# Patient Record
Sex: Male | Born: 1995 | Race: Black or African American | Hispanic: No | Marital: Single | Smoking: Current every day smoker
Health system: Southern US, Community
[De-identification: ages and names within clinical notes are randomized; demographics above are authoritative.]

## PROBLEM LIST (undated history)

## (undated) HISTORY — PX: TONSILLECTOMY: SUR1361

## (undated) HISTORY — PX: OTHER SURGICAL HISTORY: SHX169

---
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
## 2015-07-30 ENCOUNTER — Encounter: Payer: Self-pay | Admitting: *Deleted

## 2015-07-30 ENCOUNTER — Emergency Department
Admission: EM | Admit: 2015-07-30 | Discharge: 2015-07-30 | Disposition: A | Discharge status: Home / Self Care | Payer: Medicaid Other | Attending: Emergency Medicine | Admitting: Emergency Medicine

## 2015-07-30 ENCOUNTER — Emergency Department: Payer: Medicaid Other

## 2015-07-30 DIAGNOSIS — Y999 Unspecified external cause status: Secondary | ICD-10-CM | POA: Diagnosis not present

## 2015-07-30 DIAGNOSIS — S50812A Abrasion of left forearm, initial encounter: Secondary | ICD-10-CM | POA: Insufficient documentation

## 2015-07-30 DIAGNOSIS — S025XXA Fracture of tooth (traumatic), initial encounter for closed fracture: Secondary | ICD-10-CM

## 2015-07-30 DIAGNOSIS — S8002XA Contusion of left knee, initial encounter: Secondary | ICD-10-CM | POA: Diagnosis not present

## 2015-07-30 DIAGNOSIS — F1290 Cannabis use, unspecified, uncomplicated: Secondary | ICD-10-CM | POA: Insufficient documentation

## 2015-07-30 DIAGNOSIS — T07XXXA Unspecified multiple injuries, initial encounter: Secondary | ICD-10-CM

## 2015-07-30 DIAGNOSIS — F17210 Nicotine dependence, cigarettes, uncomplicated: Secondary | ICD-10-CM | POA: Insufficient documentation

## 2015-07-30 DIAGNOSIS — Z791 Long term (current) use of non-steroidal anti-inflammatories (NSAID): Secondary | ICD-10-CM | POA: Diagnosis not present

## 2015-07-30 DIAGNOSIS — Z792 Long term (current) use of antibiotics: Secondary | ICD-10-CM | POA: Diagnosis not present

## 2015-07-30 DIAGNOSIS — L089 Local infection of the skin and subcutaneous tissue, unspecified: Secondary | ICD-10-CM

## 2015-07-30 DIAGNOSIS — Y9389 Activity, other specified: Secondary | ICD-10-CM | POA: Insufficient documentation

## 2015-07-30 DIAGNOSIS — S8992XA Unspecified injury of left lower leg, initial encounter: Secondary | ICD-10-CM | POA: Diagnosis present

## 2015-07-30 DIAGNOSIS — Y92410 Unspecified street and highway as the place of occurrence of the external cause: Secondary | ICD-10-CM | POA: Diagnosis not present

## 2015-07-30 DIAGNOSIS — S8012XA Contusion of left lower leg, initial encounter: Secondary | ICD-10-CM

## 2015-07-30 MED ORDER — LIDOCAINE VISCOUS 2 % MT SOLN
15.0000 mL | Freq: Once | OROMUCOSAL | Status: AC
  Administered 2015-07-30: 15 mL via OROMUCOSAL
  Filled 2015-07-30: qty 15

## 2015-07-30 MED ORDER — OXYCODONE-ACETAMINOPHEN 5-325 MG PO TABS: 1.0000 | ORAL_TABLET | ORAL | Status: DC | PRN

## 2015-07-30 MED ORDER — OXYCODONE-ACETAMINOPHEN 5-325 MG PO TABS
1.0000 | ORAL_TABLET | ORAL | Status: DC | PRN
  Administered 2015-07-30: 1 via ORAL
  Filled 2015-07-30: qty 1

## 2015-07-30 MED ORDER — LIDOCAINE VISCOUS 2 % MT SOLN: 5.0000 mL | Freq: Four times a day (QID) | OROMUCOSAL | Status: DC | PRN
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
## 2015-07-30 NOTE — Discharge Instructions (Signed)
Take medication as directed and follow-up with dentist for fractured tooth.
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
## 2015-07-30 NOTE — ED Notes (Signed)

## 2015-10-09 ENCOUNTER — Emergency Department
Admission: EM | Admit: 2015-10-09 | Discharge: 2015-10-09 | Disposition: A | Discharge status: Home / Self Care | Payer: Medicaid Other | Attending: Emergency Medicine | Admitting: Emergency Medicine

## 2015-10-09 ENCOUNTER — Emergency Department: Payer: Medicaid Other

## 2015-10-09 ENCOUNTER — Encounter: Payer: Self-pay | Admitting: Emergency Medicine

## 2015-10-09 DIAGNOSIS — X58XXXA Exposure to other specified factors, initial encounter: Secondary | ICD-10-CM | POA: Diagnosis not present

## 2015-10-09 DIAGNOSIS — Y999 Unspecified external cause status: Secondary | ICD-10-CM | POA: Insufficient documentation

## 2015-10-09 DIAGNOSIS — S99922A Unspecified injury of left foot, initial encounter: Secondary | ICD-10-CM | POA: Diagnosis present

## 2015-10-09 DIAGNOSIS — Y929 Unspecified place or not applicable: Secondary | ICD-10-CM | POA: Insufficient documentation

## 2015-10-09 DIAGNOSIS — Z792 Long term (current) use of antibiotics: Secondary | ICD-10-CM | POA: Insufficient documentation

## 2015-10-09 DIAGNOSIS — Y9301 Activity, walking, marching and hiking: Secondary | ICD-10-CM | POA: Insufficient documentation

## 2015-10-09 DIAGNOSIS — S90412A Abrasion, left great toe, initial encounter: Secondary | ICD-10-CM

## 2015-10-09 DIAGNOSIS — F17210 Nicotine dependence, cigarettes, uncomplicated: Secondary | ICD-10-CM | POA: Diagnosis not present

## 2015-10-09 DIAGNOSIS — Z791 Long term (current) use of non-steroidal anti-inflammatories (NSAID): Secondary | ICD-10-CM | POA: Insufficient documentation

## 2015-10-09 DIAGNOSIS — S90212A Contusion of left great toe with damage to nail, initial encounter: Secondary | ICD-10-CM | POA: Diagnosis not present

## 2015-10-09 MED ORDER — IBUPROFEN 600 MG PO TABS: 600.0000 mg | ORAL_TABLET | Freq: Four times a day (QID) | ORAL | 0 refills | Status: DC | PRN

## 2015-10-09 MED ORDER — CEPHALEXIN 500 MG PO CAPS: 500.0000 mg | ORAL_CAPSULE | Freq: Three times a day (TID) | ORAL | 0 refills | Status: DC
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
## 2015-10-09 NOTE — ED Provider Notes (Signed)
[facility] Emergency Department Provider Note  ____________________________________________  Time seen: Approximately 2:42 PM  I have reviewed the triage vital signs and the nursing notes.   HISTORY  Chief Complaint Toe Pain    HPI Gavin Ford is a 20 y.o. male , NAD, presents to the emergency department with several hour history of left great toe pain. States he was walking backwards and caused his left toe to go beneath the left foot. Had pain about the toenail and noted abrasions to the toe. Has had some mild oozing and weeping as well as bleeding to the areas. Has been able to move the left ankle, foot, toe but some pain with full motion of the left great toe. Denies previous injuries to this area. Has not completed any supportive care at home. Denies any numbness, weakness, tingling.   History reviewed. No pertinent past medical history.  There are no active problems to display for this patient.   Past Surgical History:  Procedure Laterality Date  . TONSILLECTOMY    . tubes in ears age 13     when he was about 4 or 5 yrs of age    Prior to Admission medications   Medication Sig Start Date End Date Taking? Authorizing Provider  cephALEXin (KEFLEX) 500 MG capsule Take 1 capsule (500 mg total) by mouth 3 (three) times daily. 10/09/15   Jami L Hagler, PA-C  doxycycline (VIBRAMYCIN) 100 MG capsule Take 1 capsule (100 mg total) by mouth 2 (two) times daily. 10/12/13   Hope Orlene Och, NP  HYDROcodone-acetaminophen (NORCO/VICODIN) 5-325 MG per tablet Take 1 tablet by mouth every 4 (four) hours as needed. 10/12/13   Hope Orlene Och, NP  ibuprofen (ADVIL,MOTRIN) 600 MG tablet Take 1 tablet (600 mg total) by mouth every 6 (six) hours as needed. 10/09/15   Jami L Hagler, PA-C  lidocaine (XYLOCAINE) 2 % solution Use as directed 5 mLs in the mouth or throat every 6 (six) hours as needed for mouth pain. 07/30/15   Joni Reining, PA-C  naproxen (NAPROSYN) 375 MG tablet  Take 1 tablet (375 mg total) by mouth 2 (two) times daily. 10/12/13   Hope Orlene Och, NP  oxyCODONE-acetaminophen (ROXICET) 5-325 MG tablet Take 1 tablet by mouth every 4 (four) hours as needed for severe pain. 07/30/15   Joni Reining, PA-C    Allergies Review of patient's allergies indicates no known allergies.  Family History  Problem Relation Age of Onset  . Hypertension Father     Social History Social History  Substance Use Topics  . Smoking status: Current Every Day Smoker    Types: Cigarettes  . Smokeless tobacco: Never Used  . Alcohol use Yes     Comment: occasionally     Review of Systems Constitutional: No fever/chills, fatigue Musculoskeletal:  Positive for left great toe pain and loose nail. Negative for back, hip, knee, ankle, foot pain.  Skin: Positive abrasions left great toe. Negative for rash, swelling, bruising. Neurological: Negative for headaches, focal weakness or numbness. 10-point ROS otherwise negative.  ____________________________________________   PHYSICAL EXAM:  VITAL SIGNS: ED Triage Vitals  Enc Vitals Group     BP 10/09/15 1301 112/66     Pulse Rate 10/09/15 1301 91     Resp 10/09/15 1301 16     Temp 10/09/15 1301 98.4 F (36.9 C)     Temp Source 10/09/15 1301 Oral     SpO2 10/09/15 1301 97 %  Weight 10/09/15 1300 176 lb (79.8 kg)     Height 10/09/15 1300 5\' 11"  (1.803 m)     Head Circumference --      Peak Flow --      Pain Score 10/09/15 1301 5     Pain Loc --      Pain Edu? --      Excl. in GC? --      Constitutional: Alert and oriented. Well appearing and in no acute distress. Eyes: Conjunctivae are normal without injection.  Head: Atraumatic. Cardiovascular: Good peripheral circulation with 2+ pulses about the left lower extremity. Respiratory: Normal respiratory effort without tachypnea or retractions.  Musculoskeletal: FROM of the left ankle, foot toes but pain with movement of the left great toe. No lower extremity  tenderness nor edema.  No joint effusions. Neurologic:  Normal speech and language. No gross focal neurologic deficits are appreciated.  Skin:  Abrasions noted about the left great toe without active bleeding. Yellow oozing noted from the left great toenail and it is loose about the distal nail bed, but anchored well. Skin is warm, dry. No rash noted. Psychiatric: Mood and affect are normal. Speech and behavior are normal. Patient exhibits appropriate insight and judgement.   ____________________________________________   LABS  None ____________________________________________  EKG  None ____________________________________________  RADIOLOGY I have personally viewed and evaluated these images (plain radiographs) as part of my medical decision making, as well as reviewing the written report by the radiologist.  Dg Toe Great Left  Result Date: 10/09/2015 CLINICAL DATA:  First toe pain status post trauma. EXAM: LEFT GREAT TOE COMPARISON:  None. FINDINGS: There is no evidence of fracture or dislocation. There is no evidence of arthropathy or other focal bone abnormality. Soft tissues swelling about the first phalanx. IMPRESSION: No acute fracture or dislocation identified about the first phalanx. Soft tissue swelling. Electronically Signed   By: Ted Mcalpineobrinka  Dimitrova M.D.   On: 10/09/2015 14:23    ____________________________________________    PROCEDURES  Procedure(s) performed: None   Procedures   Medications - No data to display   ____________________________________________   INITIAL IMPRESSION / ASSESSMENT AND PLAN / ED COURSE  Pertinent labs & imaging results that were available during my care of the patient were reviewed by me and considered in my medical decision making (see chart for details).  Clinical Course    Patient's diagnosis is consistent with Abrasion and contusion of left great toe. Patient's toe was wrapped with nonstick gauze and he was placed in a  flat bottom cast shoe for comfort care.  Patient will be discharged home with prescriptions for Keflex and ibuprofen to take as directed. Patient is to [facility] if symptoms persist past this treatment course. Patient is given ED precautions to return to the ED for any worsening or new symptoms.    ____________________________________________  FINAL CLINICAL IMPRESSION(S) / ED DIAGNOSES  Final diagnoses:  Abrasion of left great toe, initial encounter  Contusion of left great toe with damage to nail, initial encounter      NEW MEDICATIONS STARTED DURING THIS VISIT:  Discharge Medication List as of 10/09/2015  3:12 PM    START taking these medications   Details  cephALEXin (KEFLEX) 500 MG capsule Take 1 capsule (500 mg total) by mouth 3 (three) times daily., Starting Sun 10/09/2015, Print    ibuprofen (ADVIL,MOTRIN) 600 MG tablet Take 1 tablet (600 mg total) by mouth every 6 (six) hours as needed., Starting Sun 10/09/2015,  Print             Hope Pigeon, PA-C 10/09/15 1533    Arnaldo Natal, MD 10/11/15 [phone]

## 2015-10-09 NOTE — Discharge Instructions (Signed)
Keep foot elevated when not walking for the next 2 days

## 2016-03-01 ENCOUNTER — Emergency Department (HOSPITAL_COMMUNITY)
Admission: EM | Admit: 2016-03-01 | Discharge: 2016-03-01 | Disposition: A | Discharge status: Home / Self Care | Payer: Medicaid Other | Attending: Emergency Medicine | Admitting: Emergency Medicine

## 2016-03-01 ENCOUNTER — Encounter (HOSPITAL_COMMUNITY): Payer: Self-pay | Admitting: *Deleted

## 2016-03-01 DIAGNOSIS — L0231 Cutaneous abscess of buttock: Secondary | ICD-10-CM | POA: Insufficient documentation

## 2016-03-01 DIAGNOSIS — F17210 Nicotine dependence, cigarettes, uncomplicated: Secondary | ICD-10-CM | POA: Insufficient documentation

## 2016-03-01 MED ORDER — LIDOCAINE HCL (PF) 1 % IJ SOLN
5.0000 mL | Freq: Once | INTRAMUSCULAR | Status: DC
  Filled 2016-03-01: qty 5

## 2016-03-01 MED ORDER — OXYCODONE-ACETAMINOPHEN 5-325 MG PO TABS
1.0000 | ORAL_TABLET | Freq: Once | ORAL | Status: AC
  Administered 2016-03-01: 1 via ORAL
  Filled 2016-03-01: qty 1

## 2016-03-01 MED ORDER — CEPHALEXIN 500 MG PO CAPS
500.0000 mg | ORAL_CAPSULE | Freq: Once | ORAL | Status: AC
  Administered 2016-03-01: 500 mg via ORAL
  Filled 2016-03-01: qty 1

## 2016-03-01 MED ORDER — NAPROXEN 500 MG PO TABS: 500.0000 mg | ORAL_TABLET | Freq: Two times a day (BID) | ORAL | 0 refills | Status: DC

## 2016-03-01 MED ORDER — SULFAMETHOXAZOLE-TRIMETHOPRIM 800-160 MG PO TABS: 1.0000 | ORAL_TABLET | Freq: Two times a day (BID) | ORAL | 0 refills | Status: AC

## 2016-03-01 MED ORDER — CEPHALEXIN 500 MG PO CAPS: 500.0000 mg | ORAL_CAPSULE | Freq: Two times a day (BID) | ORAL | 0 refills | Status: AC

## 2016-03-01 MED ORDER — SULFAMETHOXAZOLE-TRIMETHOPRIM 800-160 MG PO TABS
1.0000 | ORAL_TABLET | Freq: Once | ORAL | Status: AC
  Administered 2016-03-01: 1 via ORAL
  Filled 2016-03-01: qty 1

## 2016-03-01 NOTE — ED Provider Notes (Signed)
[facility]-EMERGENCY DEPT Provider Note   CSN: [id number] Arrival date & time: 03/01/16  1253  History   Chief Complaint Chief Complaint  Patient presents with  . Abscess    HPI Gavin Ford is a 21 y.o. male.  HPI  21 y.o. male presents to the Emergency Department today complaining of abscess to left inner buttocks x 2 days. Notes hx same. States that it burst yesterday with some purulence and bloody discharge. No fevers at home. BMs unchanged. Rates pain 5/10. Worse with palpation. Minimal at rest. No other symptoms noted.   History reviewed. No pertinent past medical history.  There are no active problems to display for this patient.   Past Surgical History:  Procedure Laterality Date  . TONSILLECTOMY    . tubes in ears age 89     when he was about 4 or 5 yrs of age     Home Medications    Prior to Admission medications   Not on File    Family History Family History  Problem Relation Age of Onset  . Hypertension Father     Social History Social History  Substance Use Topics  . Smoking status: Current Every Day Smoker    Types: Cigarettes  . Smokeless tobacco: Never Used     Comment: 1 pack every 3 days  . Alcohol use Yes     Comment: occasionally     Allergies   Patient has no known allergies.   Review of Systems Review of Systems  Constitutional: Negative for fever.  Gastrointestinal: Negative for abdominal pain, diarrhea, nausea and vomiting.  Skin: Positive for wound.  Allergic/Immunologic: Negative for immunocompromised state.   Physical Exam Updated Vital Signs BP 139/87 (BP Location: Left Arm)   Pulse 85   Temp 98.6 F (37 C) (Oral)   Resp 18   Ht 6' (1.829 m)   Wt 81.6 kg   SpO2 98%   BMI 24.41 kg/m   Physical Exam  Constitutional: He is oriented to person, place, and time. Vital signs are normal. He appears well-developed and well-nourished.  HENT:  Head: Normocephalic.  Right Ear: Hearing normal.  Left Ear: Hearing normal.    Eyes: Conjunctivae and EOM are normal. Pupils are equal, round, and reactive to light.  Cardiovascular: Normal rate and regular rhythm.   Pulmonary/Chest: Effort normal.  Neurological: He is alert and oriented to person, place, and time.  Skin: Skin is warm and dry.  Fluctuant abscess noted on left inferior gluteal cleft. Mild purulence noted. No erythema. Measurement around 2cmx2cm  Psychiatric: He has a normal mood and affect. His speech is normal and behavior is normal. Thought content normal.  Nursing note and vitals reviewed.  ED Treatments / Results  Labs (all labs ordered are listed, but only abnormal results are displayed) Labs Reviewed - No data to display  EKG  EKG Interpretation None       Radiology No results found.  Procedures .Marland Kitchen.Incision and Drainage Date/Time: 03/01/2016 3:07 PM Performed by: Audry PiliMOHR, Willa Brocks Authorized by: Audry PiliMOHR, Lonni Dirden   Consent:    Consent obtained:  Verbal   Consent given by:  Patient   Risks discussed:  Incomplete drainage   Alternatives discussed:  No treatment and delayed treatment Location:    Type:  Abscess   Size:  2   Location:  Anogenital   Anogenital location:  Gluteal cleft Pre-procedure details:    Skin preparation:  Betadine Procedure type:    Complexity:  Simple Procedure details:  Incision types:  Stab incision   Incision depth:  Dermal   Scalpel blade:  11   Wound management:  Probed and deloculated and irrigated with saline   Drainage:  Purulent   Drainage amount:  Moderate   Wound treatment:  Wound left open   Packing materials:  1/2 in gauze Post-procedure details:    Patient tolerance of procedure:  Tolerated well, no immediate complications   (including critical care time)  Medications Ordered in ED Medications  lidocaine (PF) (XYLOCAINE) 1 % injection 5 mL (not administered)     Initial Impression / Assessment and Plan / ED Course  I have reviewed the triage vital signs and the nursing  notes.  Pertinent labs & imaging results that were available during my care of the patient were reviewed by me and considered in my medical decision making (see chart for details).    Final Clinical Impressions(s) / ED Diagnoses   {I have reviewed the relevant previous healthcare records.  {I obtained HPI from historian.   ED Course:  Assessment: Pt is a 20yM who presents to the Emergency Department with skin abscess amenable to incision and drainage. No signs of cellulitis surrounding skin. No rectal involvement. Given Rx Abx Bactrim and Keflex, Will d/c to home with follow up to PCP in 48-72 hours for wound check.  Disposition/Plan:  DC Home Additional Verbal discharge instructions given and discussed with patient.  Pt Instructed to f/u with PCP in the next week for evaluation and treatment of symptoms. Return precautions given Pt acknowledges and agrees with plan  Supervising Physician Samuel Jester, DO  Final diagnoses:  Abscess, gluteal cleft    New Prescriptions New Prescriptions   No medications on file     Audry Pili, PA-C 03/01/16 1528    Samuel Jester, DO 03/04/16 1758

## 2016-03-01 NOTE — Discharge Instructions (Signed)
Please read and follow all provided instructions.  Your diagnoses today include:  1. Abscess, gluteal cleft    Tests performed today include: Vital signs. See below for your results today.   Medications prescribed:   Take any prescribed medications only as directed.   Home care instructions:  Follow any educational materials contained in this packet  Follow-up instructions: Return to the Emergency Department in 48 hours for a recheck if your symptoms are not significantly improved.  Please follow-up with your primary care provider in the next 1 week for further evaluation of your symptoms.   Return instructions:  Return to the Emergency Department if you have: Fever Worsening symptoms Worsening pain Worsening swelling Redness of the skin that moves away from the affected area, especially if it streaks away from the affected area  Any other emergent concerns  Additional Information: If you have recurrent abscesses, try both the following. Use a Qtip to apply an over-the-counter antibiotic to the inside of your nostrils, twice a day for 5 days. Wash your body with over-the-counter Hibaclens once a day for one week and then once every two weeks. This can reduce the amount of bacterial on your skin that causes boils and lead to fewer boils. If you continue to have multiple or recurrent boils, you should see a dermatologist (skin doctor).   Your vital signs today were: BP 139/87 (BP Location: Left Arm)    Pulse 85    Temp 98.6 F (37 C) (Oral)    Resp 18    Ht 6' (1.829 m)    Wt 81.6 kg    SpO2 98%    BMI 24.41 kg/m  If your blood pressure (BP) was elevated above 135/85 this visit, please have this repeated by your doctor within one month. --------------

## 2016-03-01 NOTE — ED Triage Notes (Signed)
Pt c/o boil to left inner buttocks x 2 days. Pt reports it has bloody drainage.

## 2016-03-08 ENCOUNTER — Encounter (HOSPITAL_COMMUNITY): Payer: Self-pay | Admitting: Emergency Medicine

## 2016-03-08 ENCOUNTER — Emergency Department (HOSPITAL_COMMUNITY)
Admission: EM | Admit: 2016-03-08 | Discharge: 2016-03-08 | Disposition: A | Discharge status: Home / Self Care | Payer: Self-pay | Attending: Emergency Medicine | Admitting: Emergency Medicine

## 2016-03-08 DIAGNOSIS — F17210 Nicotine dependence, cigarettes, uncomplicated: Secondary | ICD-10-CM | POA: Insufficient documentation

## 2016-03-08 DIAGNOSIS — K5900 Constipation, unspecified: Secondary | ICD-10-CM | POA: Insufficient documentation

## 2016-03-08 DIAGNOSIS — Z79899 Other long term (current) drug therapy: Secondary | ICD-10-CM | POA: Insufficient documentation

## 2016-03-08 NOTE — Discharge Instructions (Signed)
Please take milk of Magnesia over the counter for your symptoms if still present tomorrow. Drink plenty of fluids throughout the day, at least 6 8-oz cups of water. Follow-up with her primary care physician 2-5 days regarding today's visit.  Get help right away if: You have a fever and your symptoms suddenly get worse. You leak stool or have blood in your stool. Your abdomen is bloated. You have severe pain in your abdomen. You feel dizzy or you faint.

## 2016-03-08 NOTE — ED Provider Notes (Signed)
[facility]-EMERGENCY DEPT Provider Note   CSN: [id number] Arrival date & time: 03/08/16  2035     History   Chief Complaint Chief Complaint  Patient presents with  . Constipation    HPI Gavin Ford is a 21 y.o. male with complaints of worsening constipation 3 days. Last bowel movement was 4 days ago. He complains of having to void, but not having much come out if any during this time. Patient states he recently had an I&D done a week ago of gluteal abscess and finished that last of his antibiotics today. He states he has had no issues or complaints since then. He states he thinks the lidocaine during the procedure caused his constipation. He denies trying anything for his symptoms. Patient denies any pain to me. He denies fever, chills, nausea, vomiting, rectal pain, rectal bleeding, blood in stools, abdominal pain, bloating, urinary symptoms.   The history is provided by the patient. No language interpreter was used.    History reviewed. No pertinent past medical history.  There are no active problems to display for this patient.   Past Surgical History:  Procedure Laterality Date  . TONSILLECTOMY    . tubes in ears age 48     when he was about 4 or 5 yrs of age    leeding    Home Medications    Prior to Admission medications   Medication Sig Start Date End Date Taking? Authorizing Provider  naproxen (NAPROSYN) 500 MG tablet Take 1 tablet (500 mg total) by mouth 2 (two) times daily. 03/01/16  Yes Audry Pili, PA-C    Family History Family History  Problem Relation Age of Onset  . Hypertension Father     Social History Social History  Substance Use Topics  . Smoking status: Current Every Day Smoker    Types: Cigarettes  . Smokeless tobacco: Never Used     Comment: 1 pack every 3 days  . Alcohol use Yes     Comment: occasionally     Allergies   Patient has no known allergies.   Review of Systems Review of Systems  Constitutional: Negative for chills and  fever.  Gastrointestinal: Positive for constipation. Negative for abdominal distention, abdominal pain, anal bleeding, blood in stool, nausea and vomiting.  Genitourinary: Negative for difficulty urinating and dysuria.  Musculoskeletal: Negative for back pain.     Physical Exam Updated Vital Signs BP 119/79 (BP Location: Left Arm)   Pulse 71   Temp 97.8 F (36.6 C) (Oral)   Resp 18   Ht 6' (1.829 m)   Wt 81.6 kg   SpO2 97%   BMI 24.41 kg/m   Physical Exam  Constitutional: He is oriented to person, place, and time. He appears well-developed and well-nourished.  Well appearing  HENT:  Head: Normocephalic and atraumatic.  Nose: Nose normal.  Eyes: EOM are normal. Pupils are equal, round, and reactive to light.  Cardiovascular: Normal rate and normal heart sounds.   Pulmonary/Chest: Effort normal. No respiratory distress.  Abdominal: Soft. He exhibits no distension. There is no tenderness. There is no rigidity, no rebound, no guarding, no tenderness at McBurney's point and negative Murphy's sign.  Soft and nontender  Neurological: He is alert and oriented to person, place, and time.  Skin: Skin is warm.  Psychiatric: He has a normal mood and affect. His behavior is normal.  Nursing note and vitals reviewed.    ED Treatments / Results  Labs (all labs ordered are listed, but only  abnormal results are displayed) Labs Reviewed - No data to display  EKG  EKG Interpretation None       Radiology No results found.  Procedures Procedures (including critical care time)  Medications Ordered in ED Medications - No data to display   Initial Impression / Assessment and Plan / ED Course  I have reviewed the triage vital signs and the nursing notes.  Pertinent labs & imaging results that were available during my care of the patient were reviewed by me and considered in my medical decision making (see chart for details).    Pt afebrile, nontoxic, not ill appearing.  Afebrile, VSS, NAD. Abdomen soft and nontender. Negative murphy's sign, no focal tenderness at mcburney's point. No past medical history of constipation. Due to the symptoms only last about 3 days and not causing any other symptoms pt advised to drink more than 6 8-oz of water, high fiber diet and to add exercise to help bowel movements. Pt also recommended to use OTC milk of magnesia if still having symptoms after that on a short-term basis. Pt's symptoms may be from the antibiotics given to him, however it is more common to have gas and diarrhea from it. Pt advised to follow up with his PCP in 2-5 days regarding today's visit. Return precautions given.    Final Clinical Impressions(s) / ED Diagnoses   Final diagnoses:  Constipation, unspecified constipation type    New Prescriptions Discharge Medication List as of 03/08/2016 11:05 PM       [address]Joanthan Hlavacek Manuel GibbsEspina, [place] 03/09/16 1102    Derwood KaplanAnkit Nanavati, MD 03/10/16 [phone]

## 2016-03-08 NOTE — ED Triage Notes (Signed)
Pt states he has been on pain medication after having an abscess drained and has not been able to have a BM in 2-3 days

## 2016-03-08 NOTE — ED Notes (Signed)
Pt states understanding of care given and follow up instructions.  Pt a/o, ambulated from ED 

## 2017-07-03 IMAGING — DX DG TOE GREAT 2+V*L*
3 series · 3 of 3 positions shown · non-contrast
Comparison: None.

CLINICAL DATA: First toe pain status post trauma.

EXAM:
LEFT GREAT TOE

[toe obl]
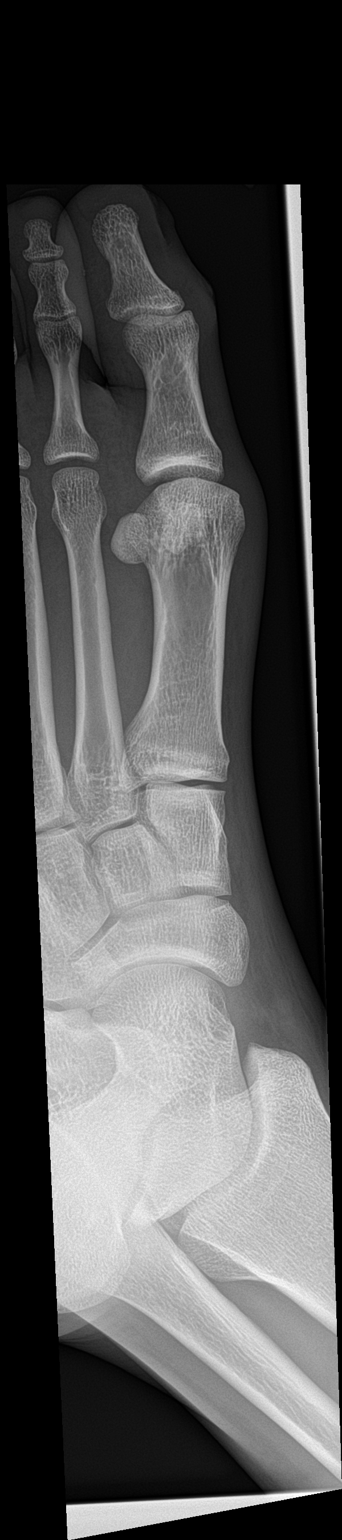

[toe lat]
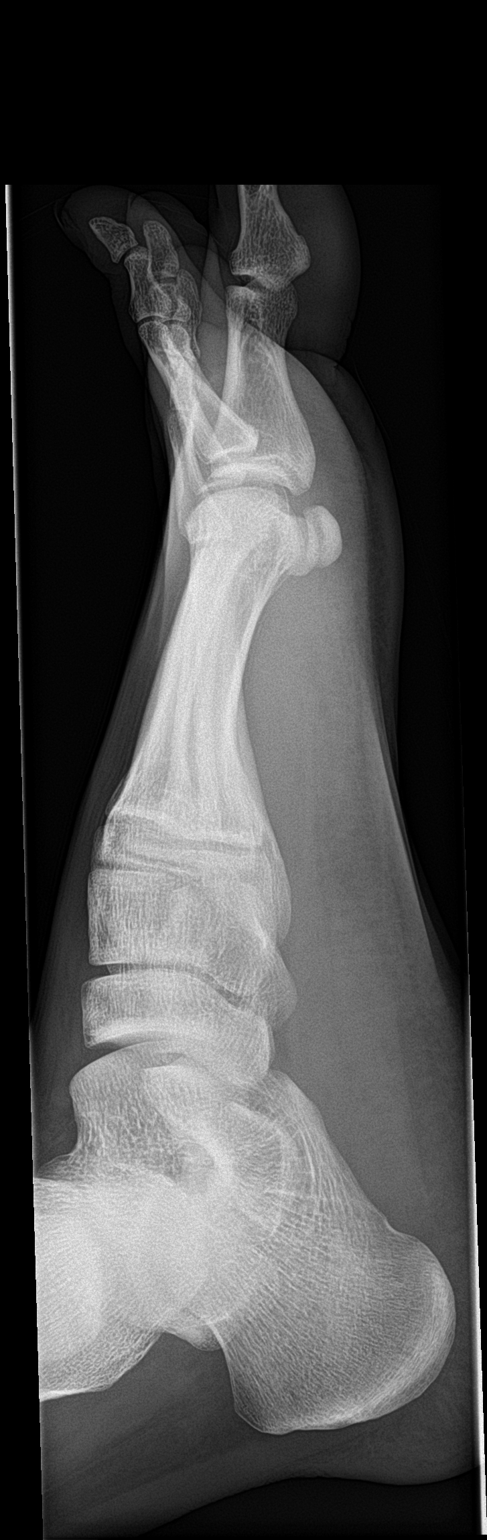

[toe ap]
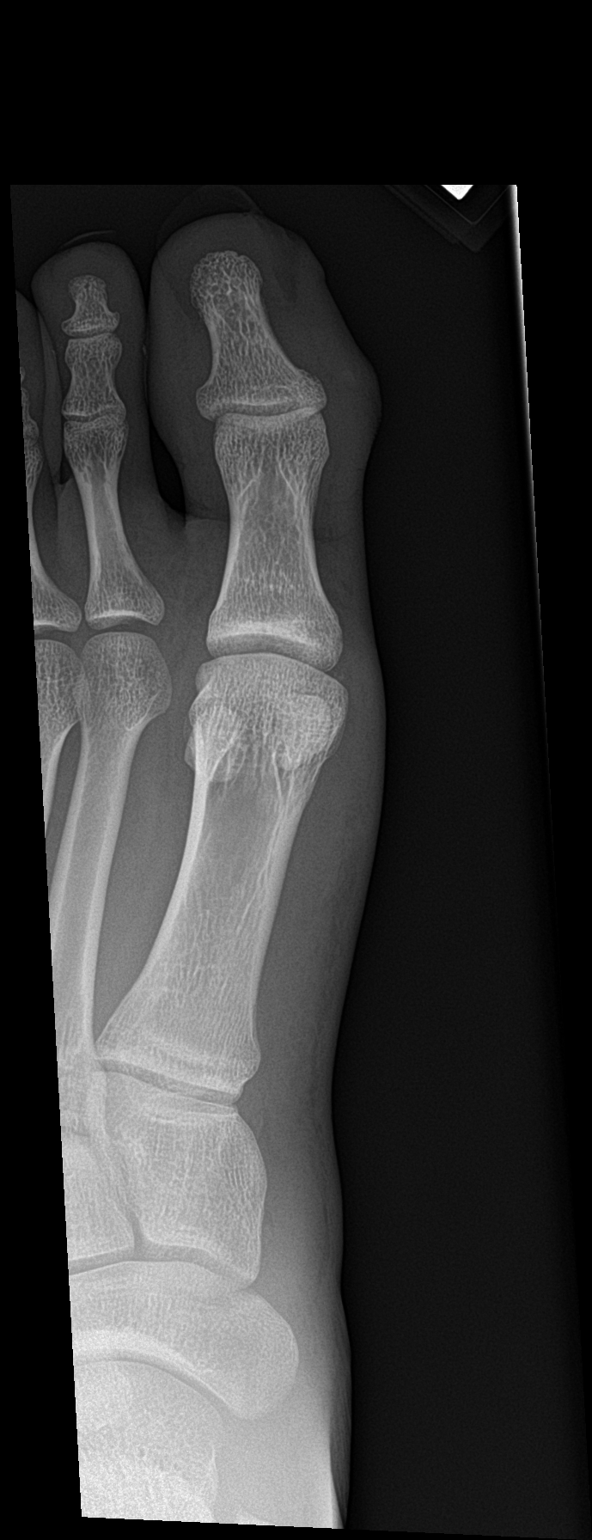

[3 of 3 positions shown; findings below may reference images not displayed]

FINDINGS: There is no evidence of fracture or dislocation. There is no
evidence of arthropathy or other focal bone abnormality. Soft
tissues swelling about the first phalanx.
IMPRESSION: No acute fracture or dislocation identified about the first phalanx.

Soft tissue swelling.

## 2017-07-14 ENCOUNTER — Emergency Department (HOSPITAL_COMMUNITY)
Admission: EM | Admit: 2017-07-14 | Discharge: 2017-07-14 | Disposition: A | Discharge status: Home / Self Care | Payer: Self-pay | Attending: Emergency Medicine | Admitting: Emergency Medicine

## 2017-07-14 ENCOUNTER — Emergency Department (HOSPITAL_COMMUNITY): Payer: Self-pay

## 2017-07-14 ENCOUNTER — Encounter (HOSPITAL_COMMUNITY): Payer: Self-pay

## 2017-07-14 ENCOUNTER — Other Ambulatory Visit: Payer: Self-pay

## 2017-07-14 DIAGNOSIS — F17210 Nicotine dependence, cigarettes, uncomplicated: Secondary | ICD-10-CM | POA: Insufficient documentation

## 2017-07-14 DIAGNOSIS — W450XXA Nail entering through skin, initial encounter: Secondary | ICD-10-CM | POA: Insufficient documentation

## 2017-07-14 DIAGNOSIS — S91331A Puncture wound without foreign body, right foot, initial encounter: Secondary | ICD-10-CM | POA: Insufficient documentation

## 2017-07-14 DIAGNOSIS — Y929 Unspecified place or not applicable: Secondary | ICD-10-CM | POA: Insufficient documentation

## 2017-07-14 DIAGNOSIS — Y9301 Activity, walking, marching and hiking: Secondary | ICD-10-CM | POA: Insufficient documentation

## 2017-07-14 DIAGNOSIS — Z23 Encounter for immunization: Secondary | ICD-10-CM | POA: Insufficient documentation

## 2017-07-14 DIAGNOSIS — Y999 Unspecified external cause status: Secondary | ICD-10-CM | POA: Insufficient documentation

## 2017-07-14 MED ORDER — CIPROFLOXACIN HCL 500 MG PO TABS: 500.0000 mg | ORAL_TABLET | Freq: Two times a day (BID) | ORAL | 0 refills | Status: AC

## 2017-07-14 MED ORDER — TETANUS-DIPHTH-ACELL PERTUSSIS 5-2.5-18.5 LF-MCG/0.5 IM SUSP
0.5000 mL | Freq: Once | INTRAMUSCULAR | Status: AC
  Administered 2017-07-14: 0.5 mL via INTRAMUSCULAR
  Filled 2017-07-14: qty 0.5

## 2017-07-14 MED ORDER — IBUPROFEN 600 MG PO TABS: 600.0000 mg | ORAL_TABLET | Freq: Four times a day (QID) | ORAL | 0 refills | Status: AC | PRN

## 2017-07-14 NOTE — ED Provider Notes (Signed)
[facility]ER[facility]ENC[facility]PART[facility] Provider Note   CSN: [id number] Arrival date & time: 07/14/17  1012     History   Chief Complaint Chief Complaint  Patient presents with  . Foot Injury    HPI Gavin Ford is a 22 y.o. male.  HPI   Gavin Ford is a 22 y.o. male who presents to the Emergency Department complaining of puncture wound to the bottom of the right foot.  Patient states he stepped on a nail that went through his sneaker.  Incident occurred about 15 to 20 minutes prior to arrival.  He complains of pain to his foot associated with weightbearing.  The wound has not been cleaned.  He reports minimal bleeding.  Last TD is unknown.  He denies redness, swelling, or pain extending up the foot or leg.     History reviewed. No pertinent past medical history.  There are no active problems to display for this patient.   Past Surgical History:  Procedure Laterality Date  . TONSILLECTOMY    . tubes in ears age 89     when he was about 4 or 5 yrs of age      Home Medications    Prior to Admission medications   Medication Sig Start Date End Date Taking? Authorizing Provider  naproxen (NAPROSYN) 500 MG tablet Take 1 tablet (500 mg total) by mouth 2 (two) times daily. 03/01/16   Audry PiliMohr, Tyler, PA-C    Family History Family History  Problem Relation Age of Onset  . Hypertension Father     Social History Social History   Tobacco Use  . Smoking status: Current Every Day Smoker    Types: Cigarettes  . Smokeless tobacco: Never Used  . Tobacco comment: 1 pack every 3 days  Substance Use Topics  . Alcohol use: Yes    Comment: occasionally  . Drug use: Yes    Types: Marijuana    Comment: last used 1 week     Allergies   Patient has no known allergies.   Review of Systems Review of Systems  Constitutional: Negative for chills and fever.  Musculoskeletal: Negative for joint swelling.  Skin: Negative for color change and wound.       Puncture wound right foot    Neurological: Negative for weakness and numbness.  All other systems reviewed and are negative.    Physical Exam Updated Vital Signs BP (!) 150/77 (BP Location: Right Arm)   Pulse 68   Temp 99 F (37.2 C) (Oral)   Resp 18   Ht 6\' 1"  (1.854 m)   Wt 113.4 kg (250 lb)   SpO2 97%   BMI 32.98 kg/m   Physical Exam  Constitutional: He is oriented to person, place, and time. He appears well-developed and well-nourished. No distress.  HENT:  Head: Normocephalic and atraumatic.  Cardiovascular: Normal rate, regular rhythm and intact distal pulses.  Pulmonary/Chest: Effort normal and breath sounds normal.  Musculoskeletal: He exhibits tenderness. He exhibits no edema or deformity.  Small puncture wound to the plantar surface of the right foot.  Bleeding controlled.  No edema.  No proximal tenderness or surrounding erythema.  Neurological: He is alert and oriented to person, place, and time. No sensory deficit. He exhibits normal muscle tone. Coordination normal.  Skin: Skin is warm and dry. Capillary refill takes less than 2 seconds.  Nursing note and vitals reviewed.    ED Treatments / Results  Labs (all labs ordered are listed, but only abnormal  results are displayed) Labs Reviewed - No data to display  EKG None  Radiology Dg Foot Complete Right  Result Date: 07/14/2017 CLINICAL DATA:  71 year old who stepped on a nail at home earlier today sustaining a puncture wound to the RIGHT foot. Initial encounter. EXAM: RIGHT FOOT COMPLETE - 3+ VIEW COMPARISON:  None. FINDINGS: No opaque foreign bodies in the plantar soft tissues. No evidence of acute fracture or dislocation. Joint spaces well preserved. Well-preserved bone mineral density. No intrinsic osseous abnormalities. IMPRESSION: No osseous abnormality. No opaque foreign bodies in the soft tissues. Electronically Signed   By: Hulan Saas M.D.   On: 07/14/2017 10:44    Procedures Procedures (including critical care  time)  Medications Ordered in ED Medications  Tdap (BOOSTRIX) injection 0.5 mL (has no administration in time range)     Initial Impression / Assessment and Plan / ED Course  I have reviewed the triage vital signs and the nursing notes.  Pertinent labs & imaging results that were available during my care of the patient were reviewed by me and considered in my medical decision making (see chart for details).     TD updated, wound cleaned and bandage.  Neurovascularly intact.  Patient agrees to wound care instructions and ER return for any signs of infection.  Prescription written for Cipro and ibuprofen  Final Clinical Impressions(s) / ED Diagnoses   Final diagnoses:  Puncture wound of right foot, initial encounter    ED Discharge Orders    None       Pauline Aus, PA-C 07/14/17 1114    Samuel Jester, DO 07/14/17 1124

## 2017-07-14 NOTE — Discharge Instructions (Signed)
Keep the wound clean with mild soap and water.  Keep it bandaged.  You will need to wear shoes and socks until the puncture wound heals.  Watch for increasing redness, increasing pain, red streaks or significant drainage.  Follow-up with your primary doctor or return to the ER if any of the symptoms develop.

## 2017-07-14 NOTE — ED Triage Notes (Signed)
Pt stepped on a nail on bottom of right foot about 15 minutes ago.

## 2019-04-08 IMAGING — DX DG FOOT COMPLETE 3+V*R*
3 series · 3 of 3 positions shown · non-contrast
Comparison: None.

CLINICAL DATA: 21-year-old who stepped on a nail at home earlier
today sustaining a puncture wound to the RIGHT foot. Initial
encounter.

EXAM:
RIGHT FOOT COMPLETE - 3+ VIEW

[foot ap]
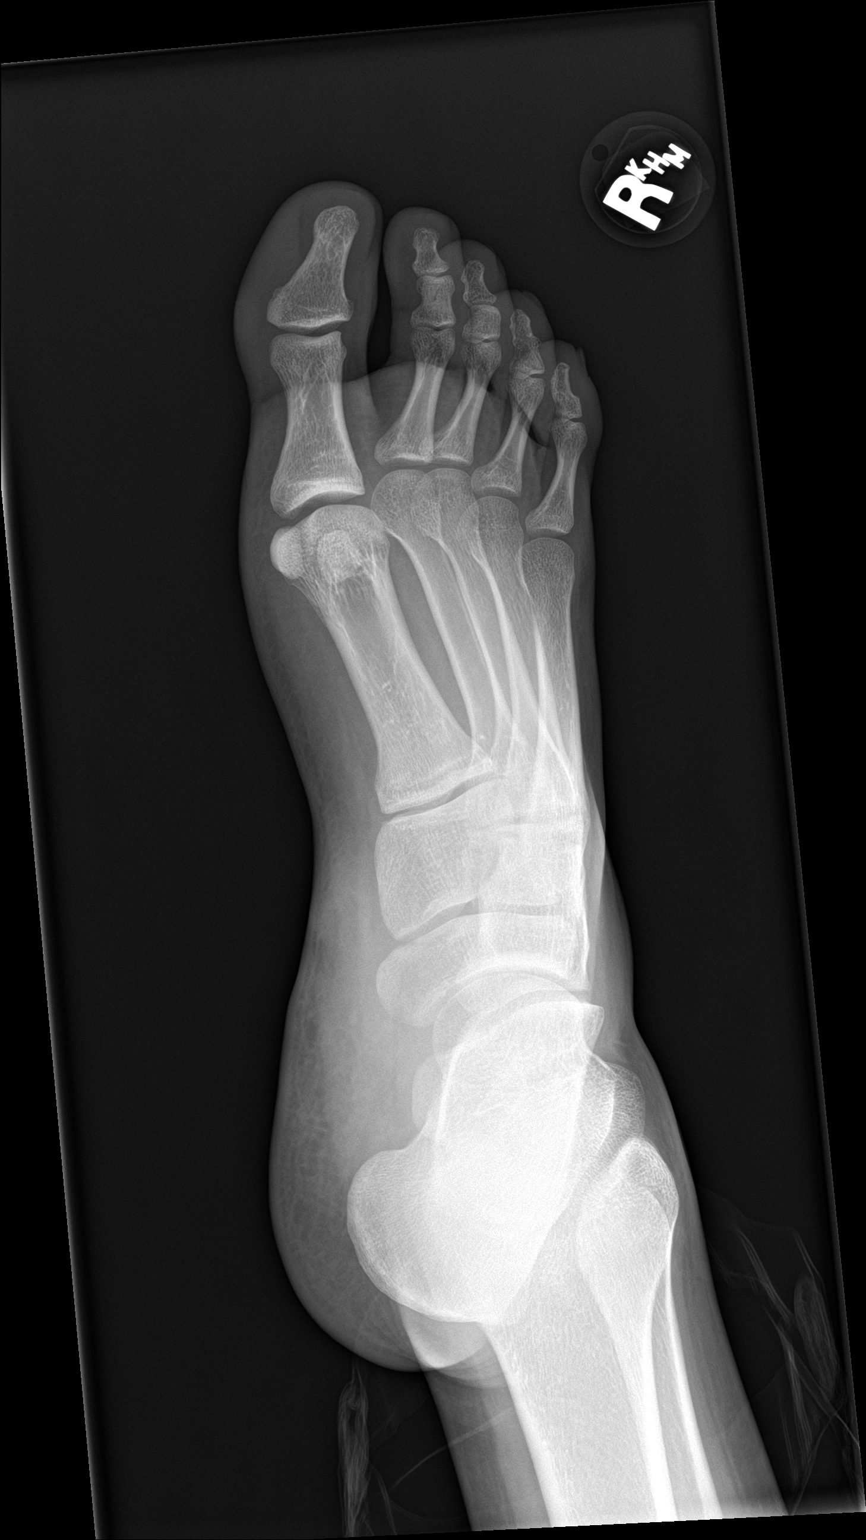

[foot obl]
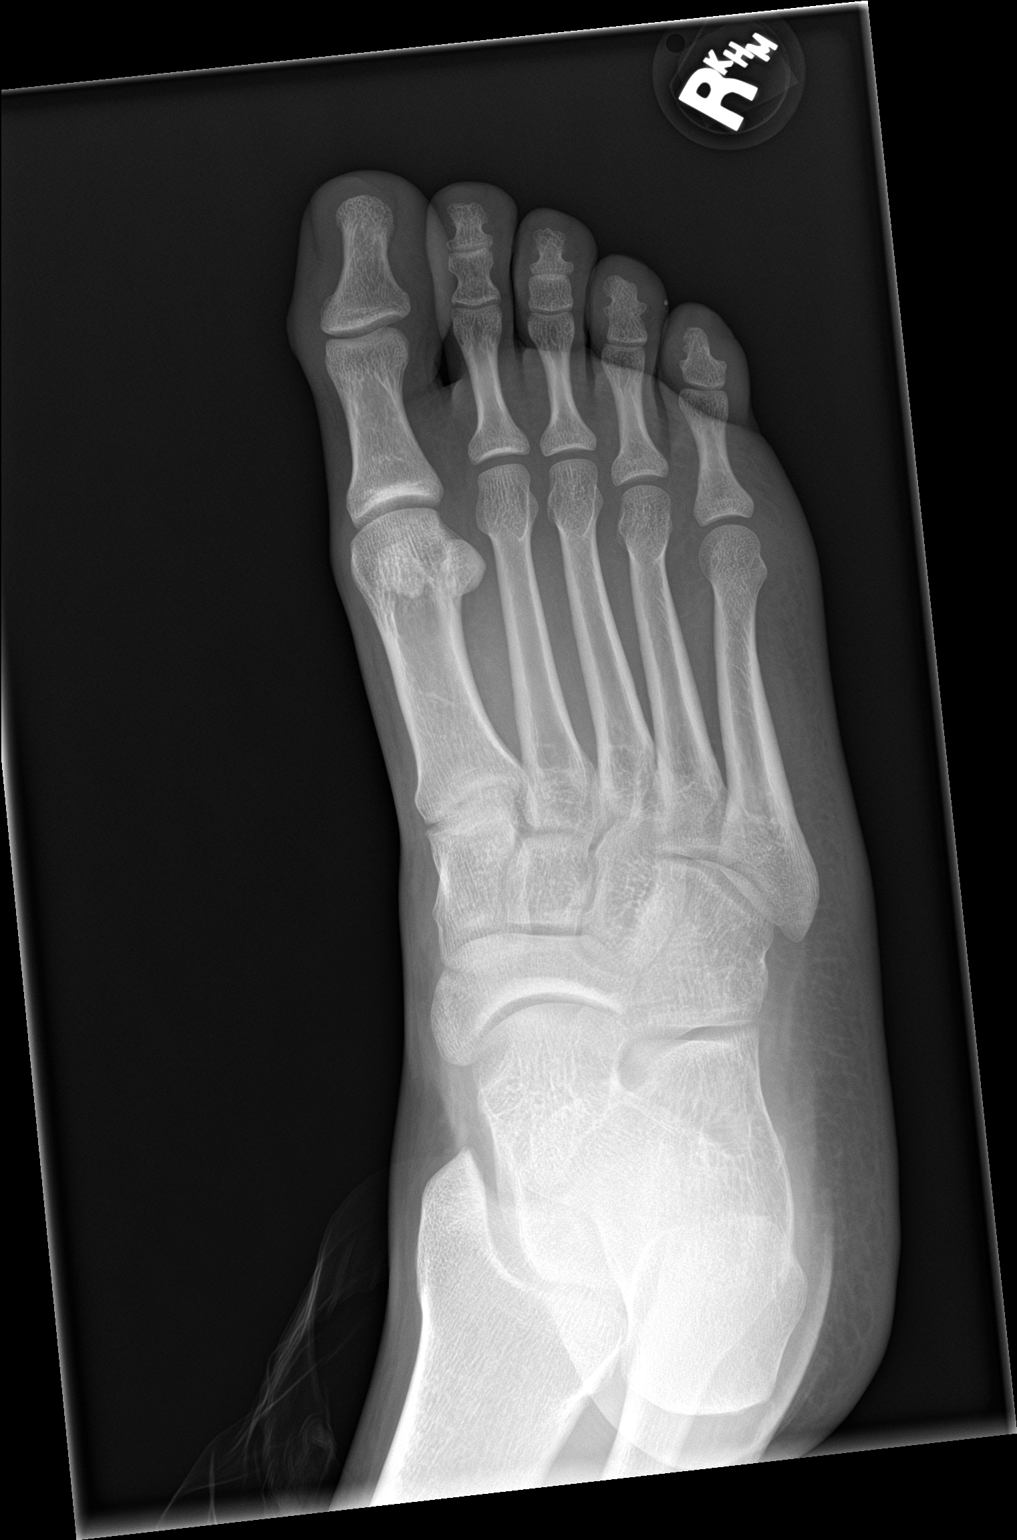

[foot lat]
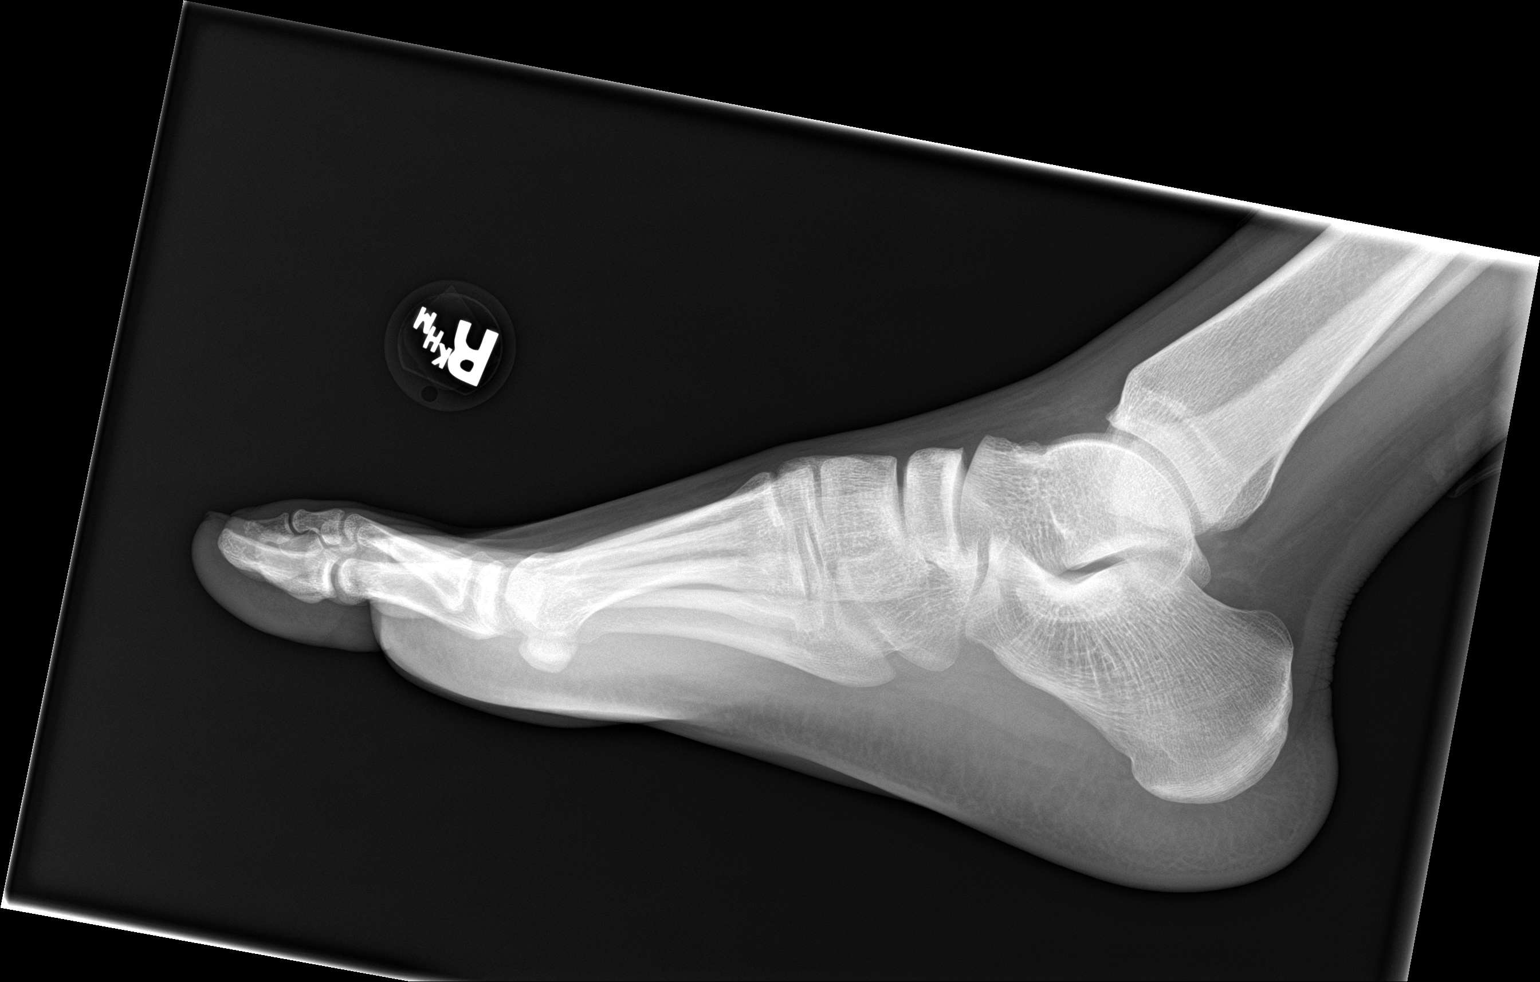

[3 of 3 positions shown; findings below may reference images not displayed]

FINDINGS: No opaque foreign bodies in the plantar soft tissues. No evidence of
acute fracture or dislocation. Joint spaces well preserved.
Well-preserved bone mineral density. No intrinsic osseous
abnormalities.
IMPRESSION: No osseous abnormality. No opaque foreign bodies in the soft
tissues.

## 2020-08-31 ENCOUNTER — Encounter (HOSPITAL_COMMUNITY): Payer: Self-pay | Admitting: *Deleted

## 2020-08-31 ENCOUNTER — Emergency Department (HOSPITAL_COMMUNITY)
Admission: EM | Admit: 2020-08-31 | Discharge: 2020-08-31 | Disposition: A | Discharge status: Home / Self Care | Payer: Self-pay | Attending: Emergency Medicine | Admitting: Emergency Medicine

## 2020-08-31 ENCOUNTER — Other Ambulatory Visit: Payer: Self-pay

## 2020-08-31 DIAGNOSIS — S61211A Laceration without foreign body of left index finger without damage to nail, initial encounter: Secondary | ICD-10-CM | POA: Insufficient documentation

## 2020-08-31 DIAGNOSIS — W260XXA Contact with knife, initial encounter: Secondary | ICD-10-CM | POA: Insufficient documentation

## 2020-08-31 DIAGNOSIS — F17210 Nicotine dependence, cigarettes, uncomplicated: Secondary | ICD-10-CM | POA: Insufficient documentation

## 2020-08-31 NOTE — ED Provider Notes (Signed)
[facility] EMERGENCY DEPARTMENT Provider Note   CSN: [id number] Arrival date & time: 08/31/20  1500     History Chief Complaint  Patient presents with   Laceration    Gavin Ford is a 25 y.o. male.  Pt complains of a cut to his finger.  Pt reports he cut finger with a rusty box cutter.  Pt concerned that he needs a tetanus.  Pt reports his last tetanus was here   The history is provided by the patient. No language interpreter was used.  Laceration Location:  Finger Finger laceration location:  L index finger Quality: straight   Laceration mechanism:  Metal edge     History reviewed. No pertinent past medical history.  There are no problems to display for this patient.   Past Surgical History:  Procedure Laterality Date   TONSILLECTOMY     tubes in ears age 68     when he was about 4 or 5 yrs of age       Family History  Problem Relation Age of Onset   Hypertension Father     Social History   Tobacco Use   Smoking status: Every Day    Types: Cigarettes   Smokeless tobacco: Never   Tobacco comments:    1 pack every 3 days  Substance Use Topics   Alcohol use: Yes    Comment: occasionally   Drug use: Yes    Types: Marijuana    Comment: last used 1 week    Home Medications Prior to Admission medications   Medication Sig Start Date End Date Taking? Authorizing Provider  ciprofloxacin (CIPRO) 500 MG tablet Take 1 tablet (500 mg total) by mouth 2 (two) times daily. 07/14/17   Triplett, Tammy, PA-C  ibuprofen (ADVIL,MOTRIN) 600 MG tablet Take 1 tablet (600 mg total) by mouth every 6 (six) hours as needed. 07/14/17   Triplett, Tammy, PA-C    Allergies    Patient has no known allergies.  Review of Systems   Review of Systems  Skin:  Positive for wound.  All other systems reviewed and are negative.  Physical Exam Updated Vital Signs BP (!) 133/91 (BP Location: Right Arm)   Pulse 60   Temp 98.4 F (36.9 C) (Oral)   Resp 18   Ht 5\' 11"  (1.803 m)    Wt 102.1 kg   SpO2 99%   BMI 31.38 kg/m   Physical Exam Vitals reviewed.  Musculoskeletal:     Comments: Superficial abrsion 30mm left index finger  from nv and ns intact   Skin:    General: Skin is warm.  Neurological:     General: No focal deficit present.     Mental Status: He is alert.  Psychiatric:        Mood and Affect: Mood normal.    ED Results / Procedures / Treatments   Labs (all labs ordered are listed, but only abnormal results are displayed) Labs Reviewed - No data to display  EKG None  Radiology No results found.  Procedures Procedures   Medications Ordered in ED Medications - No data to display  ED Course  I have reviewed the triage vital signs and the nursing notes.  Pertinent labs & imaging results that were available during my care of the patient were reviewed by me and considered in my medical decision making (see chart for details).    MDM Rules/Calculators/A&P  MDM:  Pt's last tetanus was 07/2017  Pt counseled on wound care Final Clinical Impression(s) / ED Diagnoses Final diagnoses:  Laceration of left index finger, foreign body presence unspecified, nail damage status unspecified, initial encounter    Rx / DC Orders ED Discharge Orders     None     An After Visit Summary was printed and given to the patient.    Osie Cheeks 08/31/20 1541    Jacalyn Lefevre, MD 08/31/20 2005

## 2020-08-31 NOTE — Discharge Instructions (Addendum)
Return if any problems.  Your last tetanus was 07/2017.  You do not need a tetanus until 2029

## 2020-08-31 NOTE — ED Triage Notes (Signed)
Pt states he cut his left index finger yesterday and wants to be sure his tetanus shot is up to date; pt is unsure of last tetanus
